# Patient Record
Sex: Male | Born: 1985 | Race: White | Hispanic: No | Marital: Married | Smoking: Never smoker
Health system: Southern US, Community
[De-identification: ages and names within clinical notes are randomized; demographics above are authoritative.]

---

## 2006-05-06 ENCOUNTER — Emergency Department: Payer: Self-pay | Admitting: Emergency Medicine

## 2008-02-19 ENCOUNTER — Emergency Department: Payer: Self-pay | Admitting: Emergency Medicine

## 2008-12-10 ENCOUNTER — Emergency Department: Payer: Self-pay | Admitting: Emergency Medicine

## 2010-03-13 ENCOUNTER — Emergency Department: Payer: Self-pay | Admitting: Unknown Physician Specialty

## 2010-11-14 IMAGING — CR DG FOOT COMPLETE 3+V*L*
1 series · 3 of 3 positions shown · non-contrast
Comparison: None

REASON FOR EXAM: pain, injury, swelling
COMMENTS:

PROCEDURE:     DXR - DXR FOOT LT COMP W/OBLIQUES  - December 10, 2008  [DATE]
RESULT:     History: Pain

[Series 1: view not recorded · 0.17mm/px · 3 of 3 slices shown]
[im 1/3]
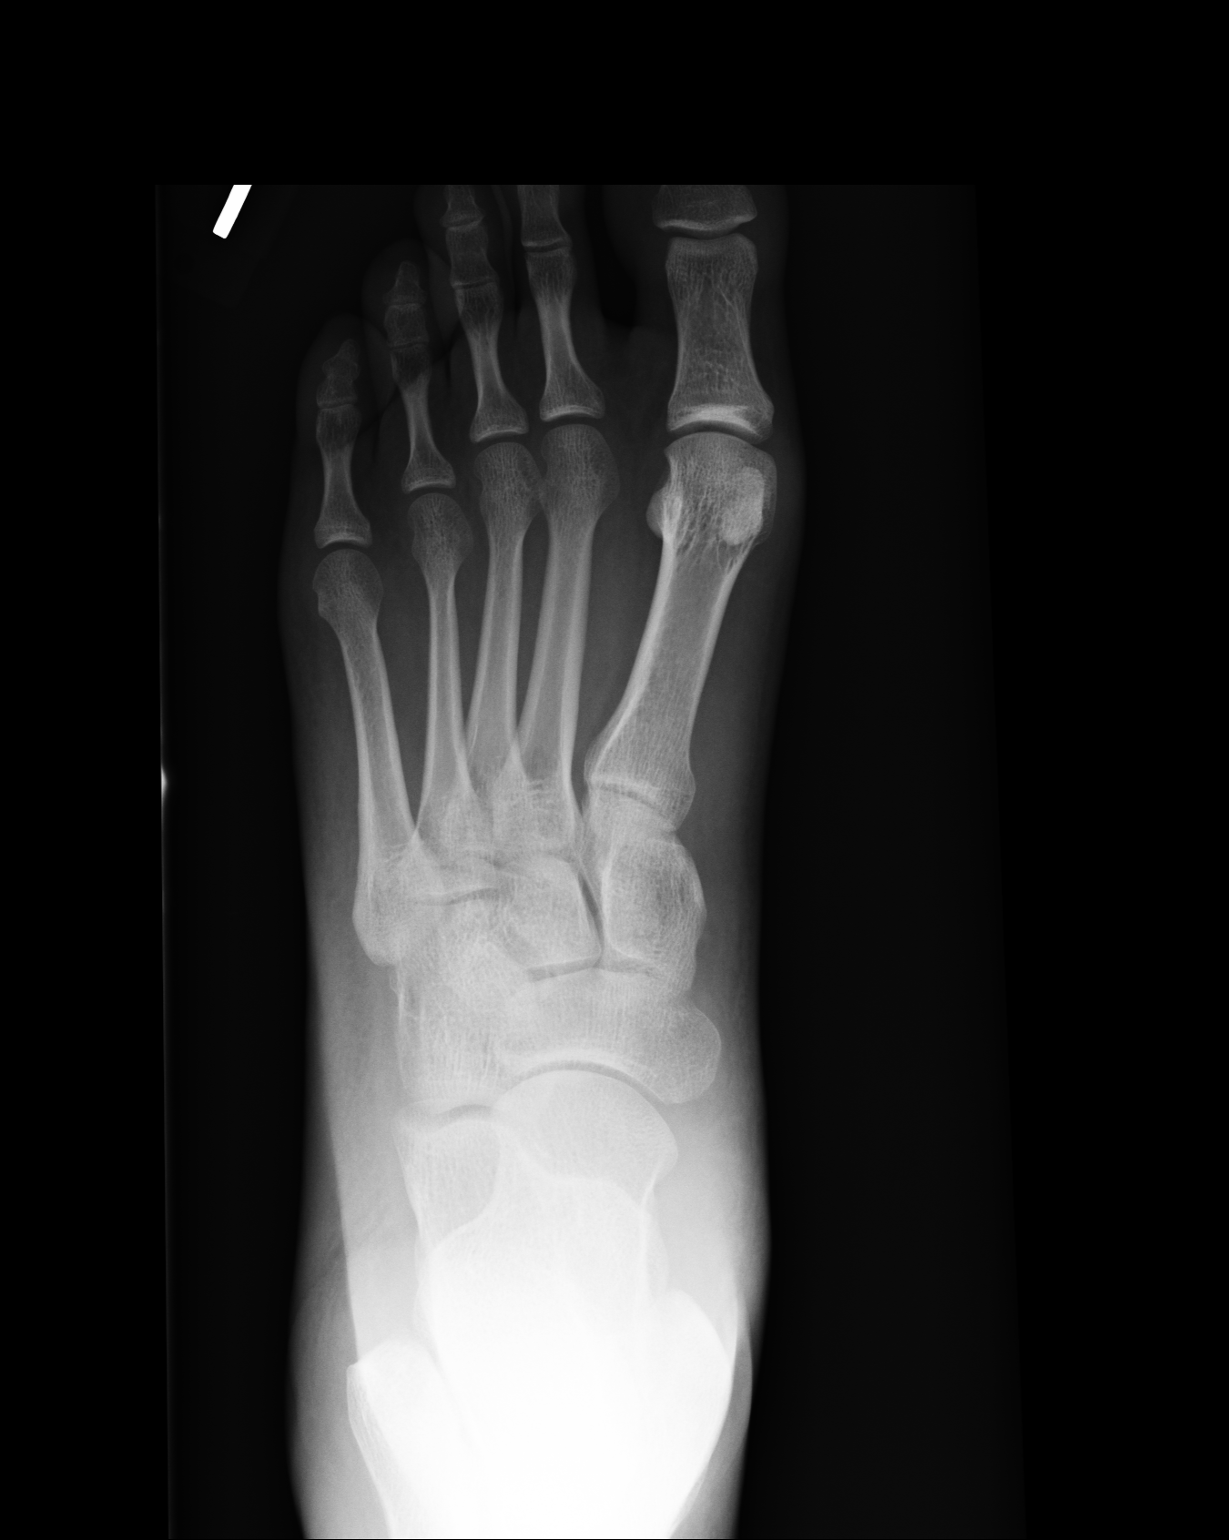
[im 2/3]
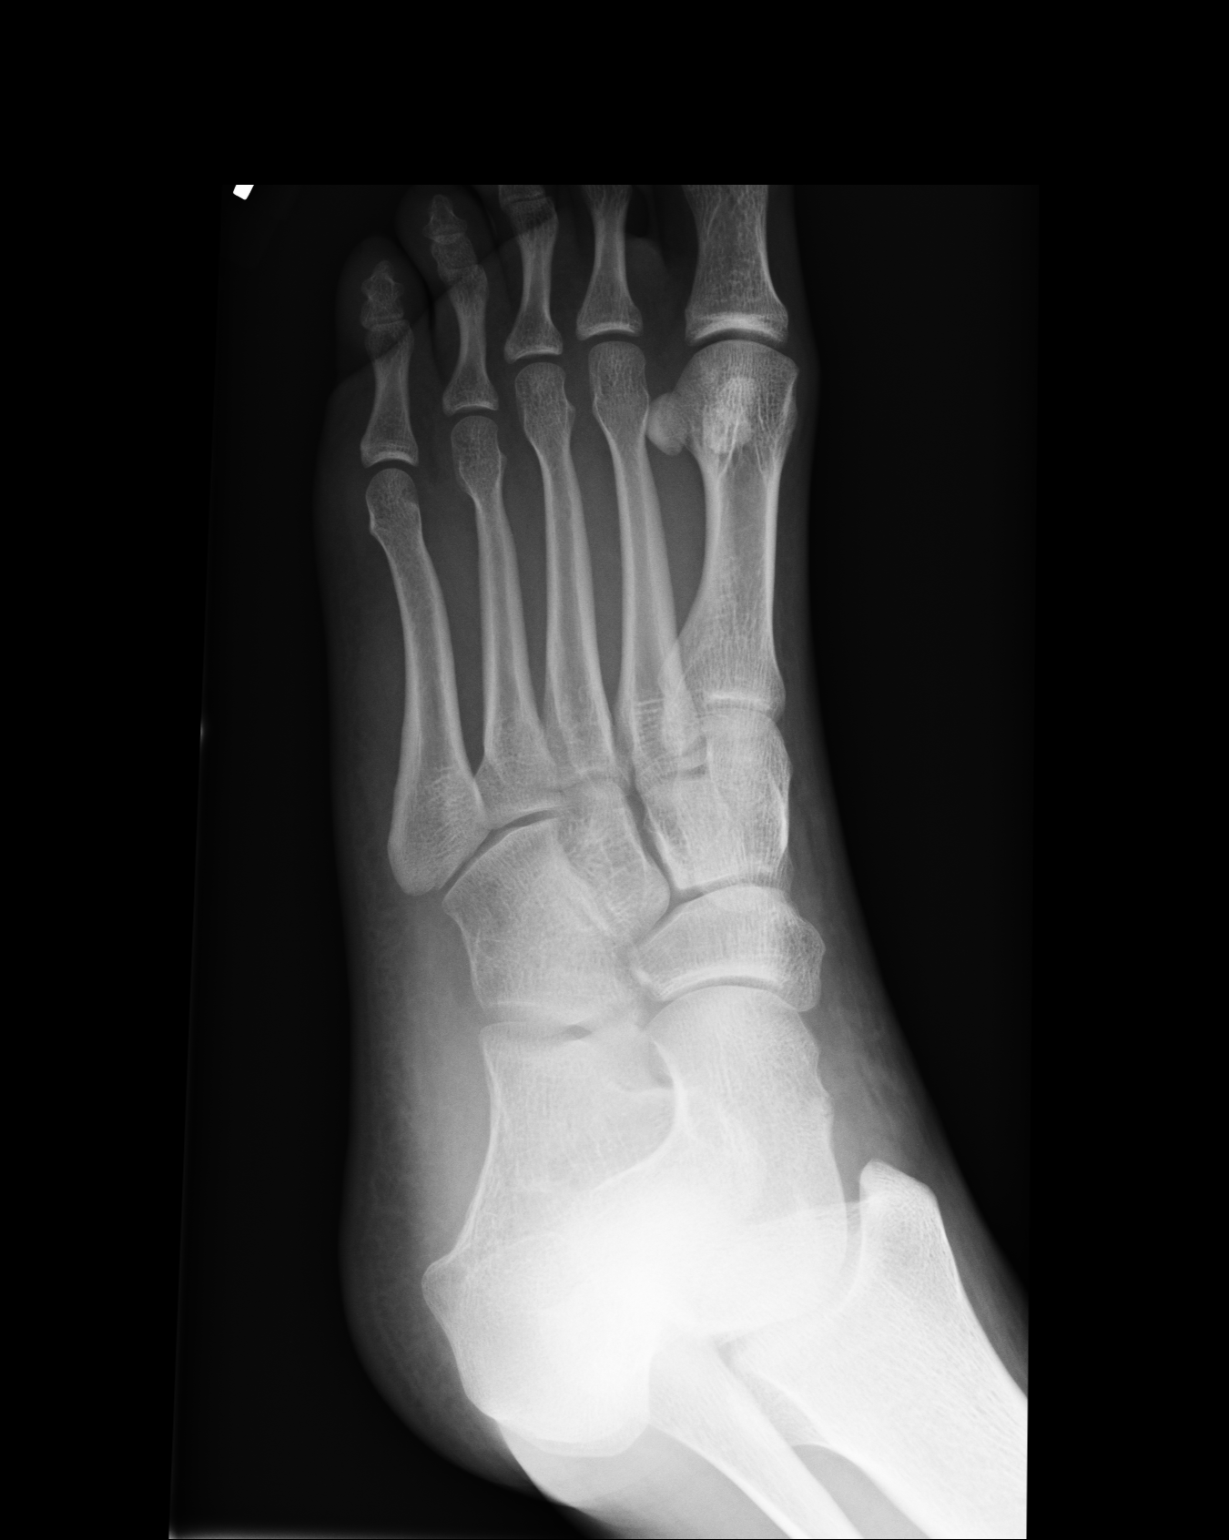
[im 3/3]
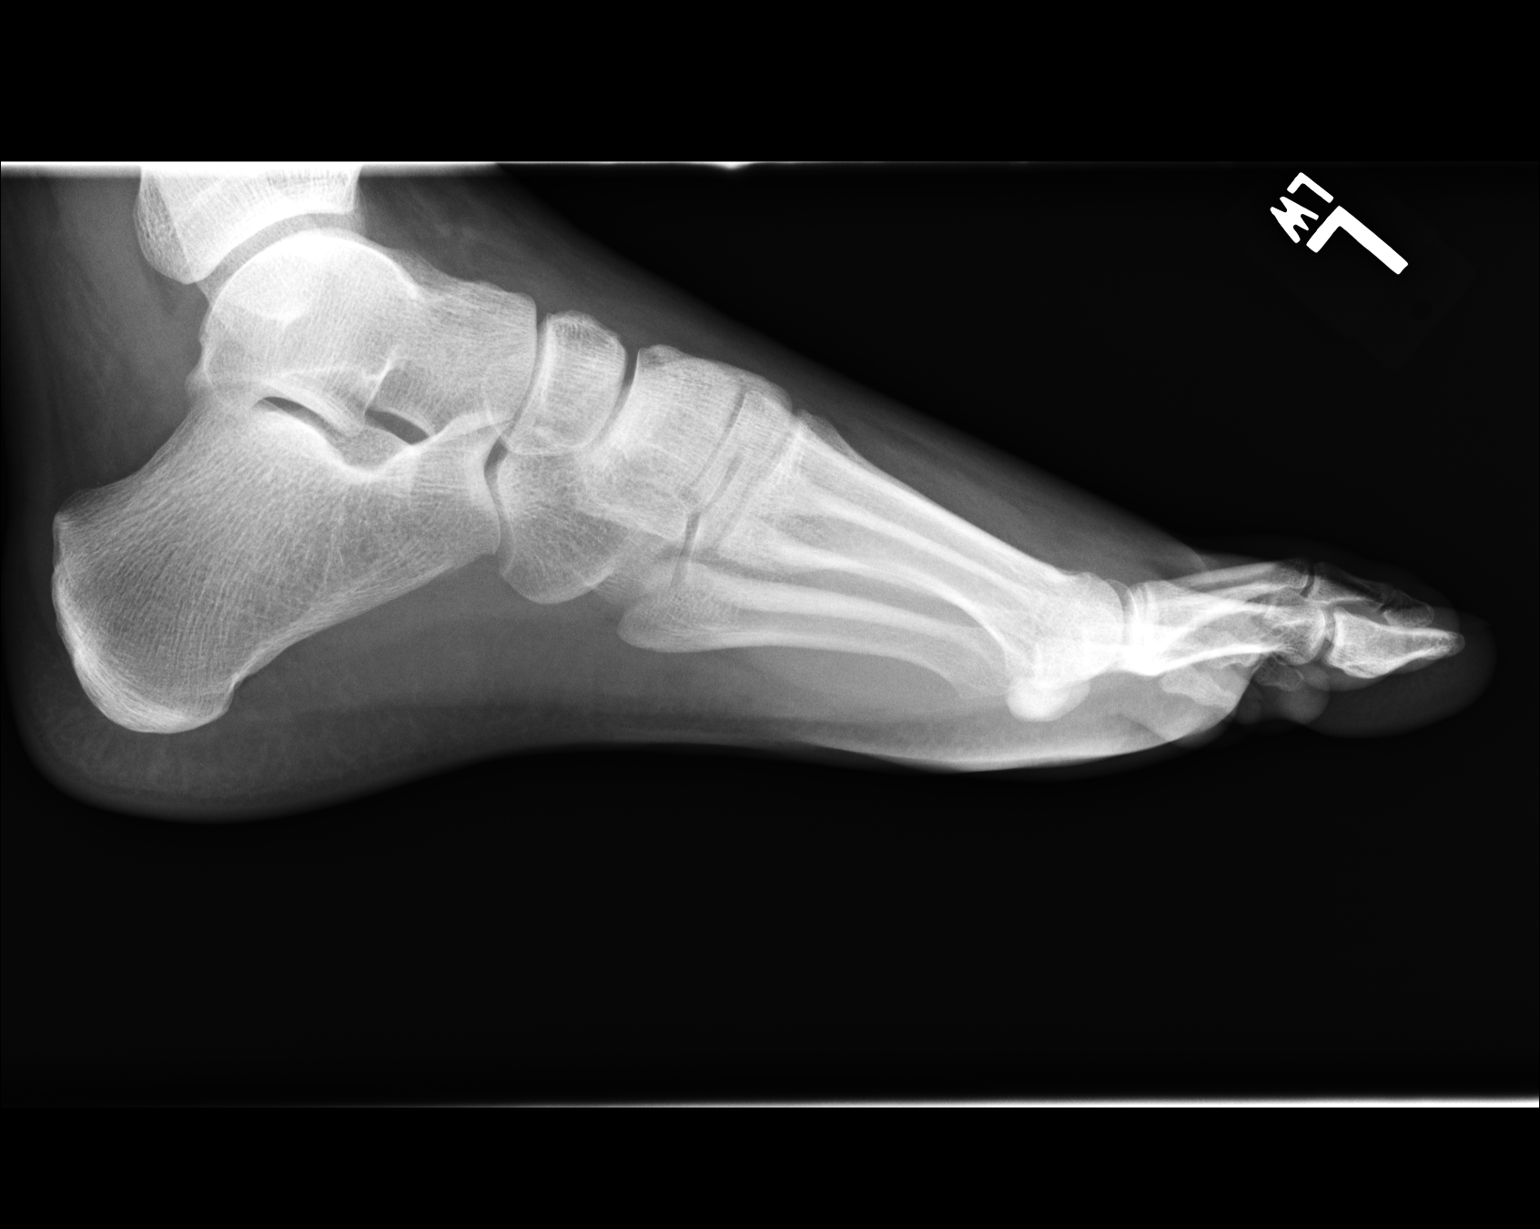

[3 of 3 positions shown; findings below may reference images not displayed]

FINDINGS: AP, oblique, and lateral views of the left foot demonstrates no fracture or
dislocation. There is no soft tissue abnormality. There is no subcutaneous
emphysema or radiopaque foreign bodies.
IMPRESSION: No acute osseous injury of the left foot.

## 2011-08-01 ENCOUNTER — Emergency Department: Payer: Self-pay | Admitting: Emergency Medicine

## 2011-09-12 ENCOUNTER — Ambulatory Visit: Payer: Self-pay

## 2012-06-27 ENCOUNTER — Emergency Department: Payer: Self-pay | Admitting: Emergency Medicine

## 2013-05-09 ENCOUNTER — Emergency Department: Payer: Self-pay | Admitting: Internal Medicine

## 2013-05-14 ENCOUNTER — Emergency Department: Payer: Self-pay | Admitting: Emergency Medicine

## 2013-05-14 LAB — BASIC METABOLIC PANEL
Anion Gap: 6 — ABNORMAL LOW (ref 7–16)
BUN: 18 mg/dL (ref 7–18)
Calcium, Total: 9 mg/dL (ref 8.5–10.1)
Chloride: 107 mmol/L (ref 98–107)
Co2: 25 mmol/L (ref 21–32)
Creatinine: 1.21 mg/dL (ref 0.60–1.30)
EGFR (African American): >60
Osmolality: 278 (ref 275–301)
Potassium: 3.9 mmol/L (ref 3.5–5.1)

## 2013-05-14 LAB — CBC: HCT: 41.8 % (ref 40.0–52.0)

## 2017-09-21 ENCOUNTER — Encounter: Payer: Self-pay | Admitting: Emergency Medicine

## 2017-09-21 ENCOUNTER — Emergency Department
Admission: EM | Admit: 2017-09-21 | Discharge: 2017-09-21 | Disposition: A | Discharge status: Home / Self Care | Payer: Self-pay | Attending: Emergency Medicine | Admitting: Emergency Medicine

## 2017-09-21 DIAGNOSIS — J020 Streptococcal pharyngitis: Secondary | ICD-10-CM | POA: Insufficient documentation

## 2017-09-21 MED ORDER — AMOXICILLIN 500 MG PO TABS: 500.0000 mg | ORAL_TABLET | Freq: Three times a day (TID) | ORAL | 0 refills | Status: DC

## 2017-09-21 NOTE — ED Provider Notes (Signed)
[facility] Emergency Department Provider Note  ____________________________________________  Time seen: Approximately 7:39 PM  I have reviewed the triage vital signs and the nursing notes.   HISTORY  Chief Complaint Sore Throat    HPI Shane Kramer is a 32 y.o. male who presents to the emergency department for evaluation and treatment of sore throat. Symptoms started yesterday. Wife and children all have strep throat. No alleviating measures have been attempted. He has had body aches and fever as well as a single episode of vomiting. No alleviating measures have been attempted for this complaint.  History reviewed. No pertinent past medical history.  There are no active problems to display for this patient.   History reviewed. No pertinent surgical history.  Prior to Admission medications   Medication Sig Start Date End Date Taking? Authorizing Provider  amoxicillin (AMOXIL) 500 MG tablet Take 1 tablet (500 mg total) by mouth 3 (three) times daily. 09/21/17   Chinita Pester, FNP    Allergies Patient has no allergy information on record.  No family history on file.  Social History Social History   Tobacco Use  . Smoking status: Never Smoker  . Smokeless tobacco: Never Used  Substance Use Topics  . Alcohol use: Yes  . Drug use: Never    Review of Systems Constitutional: Positive for fever. Eyes: No visual changes. ENT: Positive for sore throat; negative for difficulty swallowing. Respiratory: Denies shortness of breath. Gastrointestinal: No abdominal pain.  No nausea, no vomiting.  No diarrhea.  Genitourinary: Negative for dysuria. Musculoskeletal: Positive for generalized body aches. Skin: Negative for rash. Neurological: Positive for headaches, negative for focal weakness or numbness.  ____________________________________________   PHYSICAL EXAM:  VITAL SIGNS: ED Triage Vitals  Enc Vitals Group     BP 09/21/17 1822 130/79    Pulse Rate 09/21/17 1822 (!) 106     Resp 09/21/17 1822 20     Temp 09/21/17 1822 99.2 F (37.3 C)     Temp Source 09/21/17 1822 Oral     SpO2 09/21/17 1822 98 %     Weight 09/21/17 1824 210 lb (95.3 kg)     Height 09/21/17 1824 6' (1.829 m)     Head Circumference --      Peak Flow --      Pain Score 09/21/17 1823 8     Pain Loc --      Pain Edu? --      Excl. in GC? --    Constitutional: Alert and oriented. Well appearing and in no acute distress. Eyes: Conjunctivae are normal.  Head: Atraumatic. Nose: No congestion/rhinnorhea. Mouth/Throat: Mucous membranes are moist.  Oropharynx erythematous, tonsils 2+ with exudate. Uvula is midline. Neck: No stridor.  Lymphatic: Lymphadenopathy: anterior cervical lymphadenopathy Cardiovascular: Normal rate, regular rhythm. Good peripheral circulation. Respiratory: Normal respiratory effort. Lungs CTAB. Gastrointestinal: Soft and nontender. Musculoskeletal: No lower extremity tenderness nor edema.  Neurologic:  Normal speech and language. No gross focal neurologic deficits are appreciated. Speech is normal. No gait instability. Skin:  Skin is warm, dry and intact. No rash noted Psychiatric: Mood and affect are normal. Speech and behavior are normal.  ____________________________________________   LABS (all labs ordered are listed, but only abnormal results are displayed)  Labs Reviewed - No data to display ____________________________________________  EKG  Not indicated. ____________________________________________  RADIOLOGY  Not indicated. ____________________________________________   PROCEDURES  Procedure(s) performed: None  Critical Care performed: No ____________________________________________   INITIAL IMPRESSION / ASSESSMENT AND PLAN /  ED COURSE  32 year old male presenting to the emergency department for evaluation and treatment of sore throat. With the significant exposure to strep, he will be treated with  amoxicillin.  Pertinent labs & imaging results that were available during my care of the patient were reviewed by me and considered in my medical decision making (see chart for details). ____________________________________________  New Prescriptions   AMOXICILLIN (AMOXIL) 500 MG TABLET    Take 1 tablet (500 mg total) by mouth 3 (three) times daily.    FINAL CLINICAL IMPRESSION(S) / ED DIAGNOSES  Final diagnoses:  Strep throat    If controlled substance prescribed during this visit, 12 month history viewed on the [facility] prior to issuing an initial prescription for Schedule II or III opiod.   Note:  This document was prepared using Dragon voice recognition software and may include unintentional dictation errors.    Chinita Pesterriplett, Janelie Goltz B, FNP 09/21/17 Babette [id number]    Minna AntisPaduchowski, Kevin, MD 09/22/17 [phone]

## 2017-09-21 NOTE — ED Triage Notes (Signed)
Pt arrived with complaints of sore throat since yesterday.

## 2020-02-10 ENCOUNTER — Ambulatory Visit: Payer: Self-pay | Admitting: Urology

## 2020-02-19 ENCOUNTER — Ambulatory Visit (INDEPENDENT_AMBULATORY_CARE_PROVIDER_SITE_OTHER): Payer: Managed Care, Other (non HMO) | Admitting: Urology

## 2020-02-19 ENCOUNTER — Other Ambulatory Visit: Payer: Self-pay

## 2020-02-19 ENCOUNTER — Encounter: Payer: Self-pay | Admitting: Urology

## 2020-02-19 VITALS — BP 143/80 | HR 65 | Ht 72.0 in | Wt 203.0 lb

## 2020-02-19 DIAGNOSIS — Z3009 Encounter for other general counseling and advice on contraception: Secondary | ICD-10-CM | POA: Diagnosis not present

## 2020-02-19 NOTE — Patient Instructions (Signed)

## 2020-02-19 NOTE — Progress Notes (Signed)
02/19/2020 8:55 AM   Ria Comment [birth year] [id number]  Referring provider: No referring provider defined for this encounter.  Chief Complaint  Patient presents with  . VAS Consult    HPI: Shane Kramer is a 34 y.o. male who presents for vasectomy consultation.     Married with 3 children and has 1 stepchild  No prior history of urologic problems and specifically denies history chronic scrotal pain, epididymitis or orchitis  No prior hernia/pelvic surgery  No history of bleeding or clotting disorders   PMH: History reviewed. No pertinent past medical history.  Surgical History: History reviewed. No pertinent surgical history.  Home Medications:  Allergies as of 02/19/2020   No Known Allergies     Medication List       Accurate as of February 19, 2020  8:55 AM. If you have any questions, ask your nurse or doctor.        STOP taking these medications   amoxicillin 500 MG tablet Commonly known as: AMOXIL Stopped by: Riki Altes, MD       Allergies: No Known Allergies  Family History: History reviewed. No pertinent family history.  Social History:  reports that he has never smoked. He has never used smokeless tobacco. He reports current alcohol use. He reports that he does not use drugs.   Physical Exam: BP (!) 143/80   Pulse 65   Ht 6' (1.829 m)   Wt 203 lb (92.1 kg)   BMI 27.53 kg/m   Constitutional:  Alert and oriented, No acute distress. HEENT: [place] AT, moist mucus membranes.  Trachea midline, no masses. Cardiovascular: No clubbing, cyanosis, or edema. Respiratory: Normal respiratory effort, no increased work of breathing. GI: Abdomen is soft, nontender, nondistended, no abdominal masses GU: Phallus circumcised without lesions.  Testes descended bilaterally without masses or tenderness, spermatic cord/epididymis palpably normal bilaterally; vasa easily palpable Skin: No rashes, bruises or suspicious lesions. Neurologic: Grossly intact,  no focal deficits, moving all 4 extremities. Psychiatric: Normal mood and affect.   Assessment & Plan:    1.  Undesired fertility  We had a long discussion about vasectomy. We specifically discussed the procedure, recovery and the risks, benefits and alternatives of vasectomy. I explained that the procedure entails removal of a segment of each vas deferens, each of which conducts sperm, and that the purpose of this procedure is to cause sterility (inability to produce children or cause pregnancy). Vasectomy is intended to be permanent and irreversible form of contraception. Options for fertility after vasectomy include vasectomy reversal, or sperm retrieval with in vitro fertilization. These options are not always successful, and they may be expensive. We discussed reversible forms of birth control such as condoms, IUD or diaphragms, as well as the option of freezing sperm in a sperm bank prior to the vasectomy procedure. We discussed the importance of avoiding strenuous exercise for four days after vasectomy, and the importance of refraining from any form of ejaculation for seven days after vasectomy. I explained that vasectomy does not produce immediate sterility so another form of contraceptive must be used until sterility is assured by having semen checked for sperm. Thus, a post vasectomy semen analysis is necessary to confirm sterility. Rarely, vasectomy must be repeated. We discussed the approximately 1 in 2,000 risk of pregnancy after vasectomy for men who have post-vasectomy semen analysis showing absent sperm or rare non-motile sperm. Typical side effects include a small amount of oozing blood, some discomfort and mild swelling in the area of  incision.  Vasectomy does not affect sexual performance, function, please, sensation, interest, desire, satisfaction, penile erection, volume of semen or ejaculation. Other rare risks include allergy or adverse reaction to an anesthetic, testicular atrophy,  hematoma, infection/abscess, prolonged tenderness of the vas deferens, pain, swelling, painful nodule or scare (called sperm granuloma) or epididymtis. We discussed chronic testicular pain syndrome. This has been reported to occur in as many as 1-2% of men and may be permanent. This can be treated with medication, small procedures or (rarely) surgery.   He desires to schedule vasectomy   Rx Valium 10 mg 30 minutes prior to procedure sent to pharmacy as a preprocedure anxiolytic   Riki Altes, MD  [facility] [address], [address] [place], [place] [address] [phone]

## 2020-03-02 ENCOUNTER — Ambulatory Visit: Payer: Self-pay

## 2020-03-25 ENCOUNTER — Other Ambulatory Visit: Payer: Self-pay | Admitting: Urology

## 2020-03-25 ENCOUNTER — Encounter: Payer: Self-pay | Admitting: Urology

## 2020-03-25 ENCOUNTER — Ambulatory Visit (INDEPENDENT_AMBULATORY_CARE_PROVIDER_SITE_OTHER): Payer: Managed Care, Other (non HMO) | Admitting: Urology

## 2020-03-25 ENCOUNTER — Other Ambulatory Visit: Payer: Self-pay

## 2020-03-25 VITALS — BP 124/75 | HR 67 | Ht 72.0 in | Wt 201.0 lb

## 2020-03-25 DIAGNOSIS — Z302 Encounter for sterilization: Secondary | ICD-10-CM

## 2020-03-25 MED ORDER — DIAZEPAM 10 MG PO TABS: ORAL_TABLET | ORAL | 0 refills | Status: DC

## 2020-03-25 MED ORDER — HYDROCODONE-ACETAMINOPHEN 5-325 MG PO TABS: 1.0000 | ORAL_TABLET | ORAL | 0 refills | Status: DC | PRN

## 2020-03-25 NOTE — Patient Instructions (Signed)

## 2020-03-25 NOTE — Progress Notes (Signed)
Vasectomy Procedure Note  Indications: The patient is a 33 y.o. male who presents today for elective sterilization.  He has been consented for the procedure.  He is aware of the risks and benefits.  He had no additional questions.  He agrees to proceed.  He denies any other significant change since his last visit.  Pre-operative Diagnosis: Elective sterilization  Post-operative Diagnosis: Elective sterilization  Premedication: Valium 10 mg po  Surgeon: Jaque Dacy C. Azyah Flett, M.D  Description: The patient was prepped and draped in the standard fashion.  The right vas deferens was identified and brought superiorly to the anterior scrotal skin.  The skin and vas was then anesthetized utilizing 6 ml 1% lidocaine.  A small stab incision was made and spread with the vas dissector.  The vas was grasped utilizing the vas clamp and elevated out of the incision.  The vas was dissected free from surrounding tissue and vessels and an ~1 cm segment was excised.  The vas lumens were cauterized utilizing electrocautery.  The distal segment was buried in the surrounding sheath with a 3-0 chromic suture.  No significant bleeding was observed.  The vas ends were then dropped back into the hemiscrotum.  The skin was closed with hemostatic pressure.  An identical procedure was performed on the contralateral side.  Clean dry gauze was applied to the incision sites.  The patient tolerated the procedure well.  Complications:None  Recommendations: 1.  No lifting greater than 10 pounds or strenuousactivity for 1 week. 2.  Scrotal support for 1 week. 3.  Shower only for 1 week; may shower in the morning 4.  May resume intercourse in one week if no significant discomfort.  Continue alternate contraception for 12 weeks.  5.  Call for significant pain, swelling, redness, drainage or fever greater than 100.5. 6.  Rx hydrocodone/APAP 5/325 1-2 every 6 hours as needed for pain. 7.  Follow-up semen analysis in 12  weeks.   Syniah Berne, MD 

## 2020-04-01 ENCOUNTER — Other Ambulatory Visit: Payer: Self-pay

## 2020-04-01 ENCOUNTER — Encounter: Payer: Self-pay | Admitting: Urology

## 2020-04-01 ENCOUNTER — Ambulatory Visit (INDEPENDENT_AMBULATORY_CARE_PROVIDER_SITE_OTHER): Payer: Managed Care, Other (non HMO) | Admitting: Urology

## 2020-04-01 VITALS — BP 128/60 | HR 72 | Temp 98.3°F

## 2020-04-01 DIAGNOSIS — N5089 Other specified disorders of the male genital organs: Secondary | ICD-10-CM

## 2020-04-01 MED ORDER — SULFAMETHOXAZOLE-TRIMETHOPRIM 800-160 MG PO TABS: 1.0000 | ORAL_TABLET | Freq: Two times a day (BID) | ORAL | 0 refills | Status: DC

## 2020-04-06 NOTE — Progress Notes (Signed)
   04/07/2020 10:06 AM   Ria Comment 1985-08-26 [id number]  Referring provider: Carren Rang, PA-C [address] [place],  [place] [address] Chief Complaint  Patient presents with  . Follow-up    HPI: Shane Kramer is a 33 y.o. male who presents today for evaluation of post vasectomy complications. The patient is most interested in a 2nd opinion.   Patient underwent a vasectomy on 03/25/2020 with Dr. Lonna Cobb.   He reports having some pain and knots/swelling on his scrotum. He was treated with antibiotics whish improved the pain and swelling. He is still concerned but because he still has some discomfort. He feels like there is another knot forming on his scrotum. For pain he has been taking ibuprofen.     No fevers or chills.     PMH: No past medical history on file.  Surgical History: No past surgical history on file.  Home Medications:  Allergies as of 04/07/2020   No Known Allergies     Medication List       Accurate as of April 07, 2020 10:06 AM. If you have any questions, ask your nurse or doctor.        STOP taking these medications   diazepam 10 MG tablet Commonly known as: VALIUM Stopped by: Vanna Scotland, MD   HYDROcodone-acetaminophen 5-325 MG tablet Commonly known as: NORCO/VICODIN Stopped by: Vanna Scotland, MD   sulfamethoxazole-trimethoprim 800-160 MG tablet Commonly known as: BACTRIM DS Stopped by: Vanna Scotland, MD       Allergies: No Known Allergies  Family History: No family history on file.  Social History:  reports that he has never smoked. He has never used smokeless tobacco. He reports current alcohol use. He reports that he does not use drugs.   Physical Exam: BP 131/81   Pulse 71   Constitutional:  Alert and oriented, No acute distress. HEENT:  AT, moist mucus membranes.  Trachea midline, no masses. Cardiovascular: No clubbing, cyanosis, or edema. Respiratory: Normal respiratory effort, no  increased work of breathing GU:  Fullness on both spermatic cords. Scrotum without lesions, cysts, rashes and/or edema. Testicles are located scrotally bilaterally. No masses are appreciated in the testicles. Left and right epididymis are normal.   Skin well healed. Skin: No rashes, bruises or suspicious lesions. Neurologic: Grossly intact, no focal deficits, moving all 4 extremities. Psychiatric: Normal mood and affect.  Assessment & Plan:    1. Scrotal pain  - Physical exam was completely benign, fullness in the cord likely from edema and or possible very small minimal hematoma  - There is no suspicion for infection.  - Discussed supportive care and continuing NSAIDs   [facility] [address], [address] [place], [place] [address] [phone]  I, Theador Hawthorne, am acting as a scribe for Dr. Vanna Scotland.  I have reviewed the above documentation for accuracy and completeness, and I agree with the above.   Vanna Scotland, MD

## 2020-04-07 ENCOUNTER — Ambulatory Visit (INDEPENDENT_AMBULATORY_CARE_PROVIDER_SITE_OTHER): Payer: Managed Care, Other (non HMO) | Admitting: Urology

## 2020-04-07 ENCOUNTER — Other Ambulatory Visit: Payer: Self-pay

## 2020-04-07 VITALS — BP 131/81 | HR 71

## 2020-04-07 DIAGNOSIS — N5082 Scrotal pain: Secondary | ICD-10-CM

## 2020-04-13 NOTE — Progress Notes (Signed)
04/01/2020 2:25 PM   BLUFORD SEDLER 1986/02/03 [id number]  Referring provider: Carren Rang, PA-C [address] [place],  [place] [address]  Chief Complaint  Patient presents with  . Follow-up    HPI: Shane Kramer is a 34 y.o. male who is status post vasectomy who is having pain and a lump in his right scrotum.  He states that his biggest concern is that his left side feels completely normal to him and he is not experiencing any pain, but he feels a lump on his right side and it is painful.  The pain has been occurring since the vasectomy.    He underwent his vasectomy on 03/25/2020 with Dr. Lonna Cobb.  The vasectomy went as expected and there were no complications.  He states he has been compliant with the post vasectomy instructions.    Patient denies any modifying or aggravating factors.  Patient denies any gross hematuria, dysuria or suprapubic/flank pain.  Patient denies any fevers, chills, nausea or vomiting.    PMH: No past medical history on file.  Surgical History: No past surgical history on file.  Home Medications:  Allergies as of 04/01/2020   No Known Allergies     Medication List       Accurate as of April 01, 2020 11:59 PM. If you have any questions, ask your nurse or doctor.        diazepam 10 MG tablet Commonly known as: VALIUM 1 tab po 30 min prior to procedure   HYDROcodone-acetaminophen 5-325 MG tablet Commonly known as: NORCO/VICODIN Take 1 tablet by mouth every 4 (four) hours as needed for moderate pain.   sulfamethoxazole-trimethoprim 800-160 MG tablet Commonly known as: BACTRIM DS Take 1 tablet by mouth every 12 (twelve) hours. Started by: Michiel Cowboy, PA-C       Allergies: No Known Allergies  Family History: No family history on file.  Social History:  reports that he has never smoked. He has never used smokeless tobacco. He reports current alcohol use. He reports that he does not use  drugs.  ROS: Pertinent ROS in HPI  Physical Exam: BP 128/60   Pulse 72   Temp 98.3 F (36.8 C)   Constitutional:  Well nourished. Alert and oriented, No acute distress. HEENT: [place] AT, mask in place.  Trachea midline Cardiovascular: No clubbing, cyanosis, or edema. Respiratory: Normal respiratory effort, no increased work of breathing. GU: Scrotum without lesions, cysts, rashes and/or edema.  Incisions are clean and dry.  Left testicles is located scrotally. No masses are appreciated in the testicles. Left epididymis is normal.  Right testicle is located scrotally.   Right epididymis is normal.  A small tender knot (Plain M&M) is palpated within the right spermatic cord.   Neurologic: Grossly intact, no focal deficits, moving all 4 extremities. Psychiatric: Normal mood and affect.  Laboratory Data: Lab Results  Component Value Date   WBC 13.9 (H) 05/14/2013   HGB 14.6 05/14/2013   HCT 41.8 05/14/2013   MCV 88 05/14/2013   PLT 173 05/14/2013    Lab Results  Component Value Date   CREATININE 1.21 05/14/2013    Urinalysis No results found for: COLORURINE, APPEARANCEUR, LABSPEC, PHURINE, GLUCOSEU, HGBUR, BILIRUBINUR, KETONESUR, PROTEINUR, UROBILINOGEN, NITRITE, LEUKOCYTESUR  I have reviewed the labs.   Pertinent Imaging: N/A  Assessment & Plan:    1. Sperm granuloma Explained that what he is palpating may be a small sperm granuloma.  Prescribed him Bactrim DS, BID x 5  days. Reminded him to continue to use alternative methods of birth control until he is cleared with his 12 week specimen  Return if symptoms worsen or fail to improve.  These notes generated with voice recognition software. I apologize for typographical errors.  Michiel Cowboy, PA-C  [facility] [address]  [address] [place], [place] [address] [phone]

## 2020-06-08 ENCOUNTER — Emergency Department
Admission: EM | Admit: 2020-06-08 | Discharge: 2020-06-08 | Disposition: A | Discharge status: Home / Self Care | Payer: Managed Care, Other (non HMO) | Attending: Emergency Medicine | Admitting: Emergency Medicine

## 2020-06-08 ENCOUNTER — Emergency Department: Payer: Managed Care, Other (non HMO)

## 2020-06-08 ENCOUNTER — Other Ambulatory Visit: Payer: Self-pay

## 2020-06-08 DIAGNOSIS — J1289 Other viral pneumonia: Secondary | ICD-10-CM | POA: Insufficient documentation

## 2020-06-08 DIAGNOSIS — J1282 Pneumonia due to coronavirus disease 2019: Secondary | ICD-10-CM

## 2020-06-08 DIAGNOSIS — U071 COVID-19: Secondary | ICD-10-CM | POA: Insufficient documentation

## 2020-06-08 DIAGNOSIS — R0602 Shortness of breath: Secondary | ICD-10-CM | POA: Diagnosis present

## 2020-06-08 LAB — COMPREHENSIVE METABOLIC PANEL
ALT: 44 U/L (ref 0–44)
AST: 62 U/L — ABNORMAL HIGH (ref 15–41)
Albumin: 4.2 g/dL (ref 3.5–5.0)
Alkaline Phosphatase: 42 U/L (ref 38–126)
Anion gap: 10 (ref 5–15)
BUN: 12 mg/dL (ref 6–20)
CO2: 26 mmol/L (ref 22–32)
Calcium: 8.9 mg/dL (ref 8.9–10.3)
Chloride: 102 mmol/L (ref 98–111)
Creatinine, Ser: 1 mg/dL (ref 0.61–1.24)
GFR, Estimated: >60 mL/min (ref >60)
Glucose, Bld: 114 mg/dL — ABNORMAL HIGH (ref 70–99)
Potassium: 3.6 mmol/L (ref 3.5–5.1)
Sodium: 138 mmol/L (ref 135–145)
Total Bilirubin: 0.9 mg/dL (ref 0.3–1.2)
Total Protein: 8 g/dL (ref 6.5–8.1)

## 2020-06-08 LAB — CBC WITH DIFFERENTIAL/PLATELET
Abs Immature Granulocytes: 0.01 10*3/uL (ref 0.00–0.07)
Basophils Absolute: 0 10*3/uL (ref 0.0–0.1)
Basophils Relative: 0 %
Eosinophils Absolute: 0 10*3/uL (ref 0.0–0.5)
Eosinophils Relative: 0 %
HCT: 41.5 % (ref 39.0–52.0)
Hemoglobin: 14.5 g/dL (ref 13.0–17.0)
Immature Granulocytes: 0 %
Lymphocytes Relative: 27 %
Lymphs Abs: 0.9 10*3/uL (ref 0.7–4.0)
MCH: 30.8 pg (ref 26.0–34.0)
MCHC: 34.9 g/dL (ref 30.0–36.0)
MCV: 88.1 fL (ref 80.0–100.0)
Monocytes Absolute: 0.3 10*3/uL (ref 0.1–1.0)
Monocytes Relative: 10 %
Neutro Abs: 2 10*3/uL (ref 1.7–7.7)
Neutrophils Relative %: 63 %
Platelets: 134 10*3/uL — ABNORMAL LOW (ref 150–400)
RBC: 4.71 MIL/uL (ref 4.22–5.81)
RDW: 12.1 % (ref 11.5–15.5)
WBC: 3.2 10*3/uL — ABNORMAL LOW (ref 4.0–10.5)
nRBC: 0 % (ref 0.0–0.2)

## 2020-06-08 LAB — FIBRIN DERIVATIVES D-DIMER (ARMC ONLY): Fibrin derivatives D-dimer (ARMC): 703.51 ng/mL (FEU) — ABNORMAL HIGH (ref 0.00–499.00)

## 2020-06-08 MED ORDER — IOHEXOL 350 MG/ML SOLN
75.0000 mL | Freq: Once | INTRAVENOUS | Status: AC | PRN
  Administered 2020-06-08: 75 mL via INTRAVENOUS
  Filled 2020-06-08: qty 75

## 2020-06-08 MED ORDER — PREDNISONE 20 MG PO TABS
60.0000 mg | ORAL_TABLET | Freq: Once | ORAL | Status: AC
  Administered 2020-06-08: 23:00:00 60 mg via ORAL
  Filled 2020-06-08: qty 3

## 2020-06-08 MED ORDER — PROMETHAZINE-DM 6.25-15 MG/5ML PO SYRP: 5.0000 mL | ORAL_SOLUTION | Freq: Four times a day (QID) | ORAL | 0 refills | Status: DC | PRN

## 2020-06-08 MED ORDER — PREDNISONE 10 MG PO TABS: ORAL_TABLET | ORAL | 0 refills | Status: DC

## 2020-06-08 MED ORDER — HYDROCODONE-HOMATROPINE 5-1.5 MG/5ML PO SYRP
5.0000 mL | ORAL_SOLUTION | Freq: Once | ORAL | Status: AC
  Administered 2020-06-08: 23:00:00 5 mL via ORAL
  Filled 2020-06-08: qty 5

## 2020-06-08 MED ORDER — ALBUTEROL SULFATE HFA 108 (90 BASE) MCG/ACT IN AERS: 2.0000 | INHALATION_SPRAY | Freq: Four times a day (QID) | RESPIRATORY_TRACT | 2 refills | Status: AC | PRN

## 2020-06-08 NOTE — ED Triage Notes (Signed)
Pt dx with Covid last Tuesday and here for shob, no distress noted at this time. Pt scheduled for infusions tomorrow.

## 2020-06-08 NOTE — ED Notes (Signed)
Pt taken to CT.

## 2020-06-08 NOTE — Discharge Instructions (Addendum)
You were seen today shortness of breath due to Covid 19. Your xray shows pneumonia. Your CT did not show evidence of a blood clot. We have treated you with steroids, cough medication and Albuterol. There is no indication for antibiotics at this time. Rest, drink plenty of fluids, self quarantine for 10 days or until 3 days symptom free without fever or medication. Maintain social distancing, masking and frequent handwashing. Return to the ER for new or worsening symptoms.

## 2020-06-08 NOTE — ED Notes (Signed)
Pt refused covid swab. °

## 2020-06-08 NOTE — ED Provider Notes (Addendum)
[facility] Emergency Department Provider Note ____________________________________________  Time seen: 64  I have reviewed the triage vital signs and the nursing notes.  HISTORY  Chief Complaint  Shortness of Breath   HPI Shane Kramer is a 34 y.o. male presents to the ER today with complaint of headache, nasal congestion, cough, shortness of breath, fever and body aches.  He reports this started 6 days ago.  The headache is located in his forehead.  He describes the pain as pressure.  He denies dizziness or visual changes.  He is not blowing anything out of his nose.  The cough is dry and nonproductive.  He denies runny nose, ear pain, sore throat, loss of taste/smell, chest pain.  He has had some intermittent nausea and vomiting of mucus but denies diarrhea, constipation or blood in his stool.  He has run fevers up to 101 at home, had body aches and chills.  He has tried ibuprofen, albuterol and albuterol nebulizer OTC with minimal relief of symptoms.  He is scheduled to have monoclonal antibody infusions tomorrow.  He does not smoke.  He did not get his Covid vaccine.  No past medical history on file.  There are no problems to display for this patient.   No past surgical history on file.  Prior to Admission medications   Medication Sig Start Date End Date Taking? Authorizing Provider  albuterol (VENTOLIN HFA) 108 (90 Base) MCG/ACT inhaler Inhale 2 puffs into the lungs every 6 (six) hours as needed for wheezing or shortness of breath. 06/08/20   Lorre Munroe, NP  predniSONE (DELTASONE) 10 MG tablet Take 6 tabs day 1, 5 tabs day 2, 4 tabs day 3, 3 tabs day 4, 2 tabs day 5, 1 tab day 6 06/08/20   Lorre Munroe, NP  promethazine-dextromethorphan (PROMETHAZINE-DM) 6.25-15 MG/5ML syrup Take 5 mLs by mouth 4 (four) times daily as needed for cough. 06/08/20   Lorre Munroe, NP    Allergies Patient has no known allergies.  No family history on  file.  Social History Social History   Tobacco Use  . Smoking status: Never Smoker  . Smokeless tobacco: Never Used  Substance Use Topics  . Alcohol use: Yes  . Drug use: Never    Review of Systems  Constitutional: Positive for fever and body aches.  Negative for chills. Eyes: Negative for visual changes. ENT: Positive for nasal congestion.  Negative for runny nose, ear pain, loss of taste/smell.  Or sore throat. Cardiovascular: Negative for chest pain or chest tightness. Respiratory: Positive for cough and  shortness of breath. Gastrointestinal: Positive for nausea and vomiting.  Negative for abdominal pain, constipation, diarrhea or blood in stool. Genitourinary: Negative for dysuria. Neurological: Positive for headaches.  Negative for focal weakness, tingling or numbness. ____________________________________________  PHYSICAL EXAM:  VITAL SIGNS: ED Triage Vitals  Enc Vitals Group     BP 06/08/20 1905 (!) 135/93     Pulse Rate 06/08/20 1905 (!) 112     Resp 06/08/20 1905 20     Temp 06/08/20 1905 98.8 F (37.1 C)     Temp Source 06/08/20 1905 Oral     SpO2 06/08/20 1905 100 %     Weight 06/08/20 1906 205 lb (93 kg)     Height 06/08/20 1906 6' (1.829 m)     Head Circumference --      Peak Flow --      Pain Score 06/08/20 1906 7  Pain Loc --      Pain Edu? --      Excl. in GC? --     Constitutional: Alert and oriented. Well appearing and in no distress. Head: Normocephalic and atraumatic. Eyes: Conjunctivae are normal. PERRL. Normal extraocular movements Nose: No congestion/rhinorrhea/epistaxis. Mouth/Throat: Mucous membranes are moist.  No posterior pharynx erythema or exudate noted. Hematological/Lymphatic/Immunological: No cervical lymphadenopathy. Cardiovascular: Tachycardic, regular rhythm. Respiratory: Normal respiratory effort. No wheezes/rales/rhonchi. Gastrointestinal: Soft and nontender. No distention. Musculoskeletal: No difficulty with  gait. Neurologic:  Normal speech and language. No gross focal neurologic deficits are appreciated. Skin:  Skin is warm, dry and intact. No rash noted.  ____________________________________________   RADIOLOGY  Imaging Orders     DG Chest 2 View     CT Angio Chest PE W and/or Wo Contrast IMPRESSION: 1. Findings consistent with multifocal pneumonia greatest in the left lower lobe, consistent with COVID-19.  IMPRESSION: 1. No evidence of pulmonary embolus. 2. Multifocal bilateral pneumonia consistent with COVID 19.  LABS:  Labs Reviewed  CBC WITH DIFFERENTIAL/PLATELET - Abnormal; Notable for the following components:      Result Value   WBC 3.2 (*)    Platelets 134 (*)    All other components within normal limits  FIBRIN DERIVATIVES D-DIMER ([facility] ONLY) - Abnormal; Notable for the following components:   Fibrin derivatives D-dimer ([facility]) 703.51 (*)    All other components within normal limits  COMPREHENSIVE METABOLIC PANEL - Abnormal; Notable for the following components:   Glucose, Bld 114 (*)    AST 62 (*)    All other components within normal limits   ____________________________________________    INITIAL IMPRESSION / ASSESSMENT AND PLAN / ED COURSE  Covid 19, SOB:  Chest xray today c/w LLL viral pneumonia Will check CBC, CMET and D dimer Given elevated D, will obtain CTA chest- negative for PE Prednisone 60 mg PO x 1 Hycodan 5 mL PO x 1 RX for Pred Taper x 6 days RX for Albuterol inhaler 1-2 puffs Q4-6H prn RX for Promethazine DM for cough Follow up with PCP if needed, return precautions discussed ____________________________________________  FINAL CLINICAL IMPRESSION(S) / ED DIAGNOSES  Final diagnoses:  Pneumonia due to COVID-19 virus      Lorre Munroe, NP 06/08/20 2257    Lorre Munroe, NP 06/08/20 2310    Delton Prairie, MD 06/08/20 2315

## 2020-06-24 ENCOUNTER — Other Ambulatory Visit: Payer: Self-pay | Admitting: *Deleted

## 2020-06-24 DIAGNOSIS — Z9852 Vasectomy status: Secondary | ICD-10-CM

## 2020-06-25 ENCOUNTER — Other Ambulatory Visit: Payer: Self-pay

## 2020-06-25 ENCOUNTER — Other Ambulatory Visit: Payer: Commercial Managed Care - HMO

## 2020-06-25 DIAGNOSIS — Z9852 Vasectomy status: Secondary | ICD-10-CM

## 2020-06-26 ENCOUNTER — Encounter: Payer: Self-pay | Admitting: Urology

## 2020-06-26 LAB — POST-VAS SPERM EVALUATION,QUAL: Volume: 6.8 mL

## 2020-06-29 ENCOUNTER — Telehealth: Payer: Self-pay | Admitting: Family Medicine

## 2020-06-29 NOTE — Telephone Encounter (Signed)
LMOM for patient to return call.

## 2020-06-29 NOTE — Telephone Encounter (Signed)
-----   Message from Riki Altes, MD sent at 06/28/2020 10:06 AM EST ----- Semen analysis did show sperm present.  Continue alternate contraception and repeat analysis 4-6 weeks

## 2020-07-02 NOTE — Telephone Encounter (Signed)
Patient notified and lab appointment has been made.

## 2020-08-03 ENCOUNTER — Other Ambulatory Visit: Payer: Self-pay

## 2020-08-03 DIAGNOSIS — Z302 Encounter for sterilization: Secondary | ICD-10-CM

## 2020-08-04 ENCOUNTER — Other Ambulatory Visit: Payer: Commercial Managed Care - HMO

## 2020-08-04 ENCOUNTER — Other Ambulatory Visit: Payer: Self-pay

## 2020-08-04 ENCOUNTER — Other Ambulatory Visit: Payer: Self-pay | Admitting: Urology

## 2020-08-04 DIAGNOSIS — Z302 Encounter for sterilization: Secondary | ICD-10-CM

## 2020-08-05 LAB — POST-VAS SPERM EVALUATION,QUAL: Volume: 6.2 mL

## 2020-08-06 ENCOUNTER — Telehealth: Payer: Self-pay | Admitting: *Deleted

## 2020-08-06 NOTE — Telephone Encounter (Signed)
-----   Message from Riki Altes, MD sent at 08/06/2020 10:02 AM EST ----- Repeat semen analysis showed no sperm present.  Okay to use vasectomy as primary contraception.

## 2020-08-07 NOTE — Telephone Encounter (Signed)
Notified patient as instructed, patient pleased °

## 2021-01-01 ENCOUNTER — Ambulatory Visit
Admission: EM | Admit: 2021-01-01 | Discharge: 2021-01-01 | Disposition: A | Discharge status: Home / Self Care | Payer: Commercial Managed Care - HMO | Attending: Emergency Medicine | Admitting: Emergency Medicine

## 2021-01-01 ENCOUNTER — Other Ambulatory Visit: Payer: Self-pay

## 2021-01-01 DIAGNOSIS — L0100 Impetigo, unspecified: Secondary | ICD-10-CM | POA: Diagnosis not present

## 2021-01-01 MED ORDER — AMOXICILLIN-POT CLAVULANATE 875-125 MG PO TABS: 1.0000 | ORAL_TABLET | Freq: Two times a day (BID) | ORAL | 0 refills | Status: AC
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
## 2021-01-01 NOTE — ED Triage Notes (Signed)
Pt c/o an area of irritation to his lower lip that started Tuesday. Pt also has a small area on his left nostril and a rash to his left inner arm.

## 2021-01-01 NOTE — Discharge Instructions (Addendum)
Take the Augmentin twice daily with food for 10 days.  Apply Aquaphor to your lips to help with the dryness.  Return for re-evaluation for new or worsening symptoms.

## 2022-05-13 IMAGING — CR DG CHEST 2V
1 series · 3 of 3 positions shown · non-contrast
Comparison: 09/12/2011

CLINICAL DATA: VT164-BP diagnosed 6 days ago, short of breath

EXAM:
CHEST - 2 VIEW

[Series 1: dg chest 2 view · 0.14mm/px · 3 of 3 slices shown]
[im 1/3]
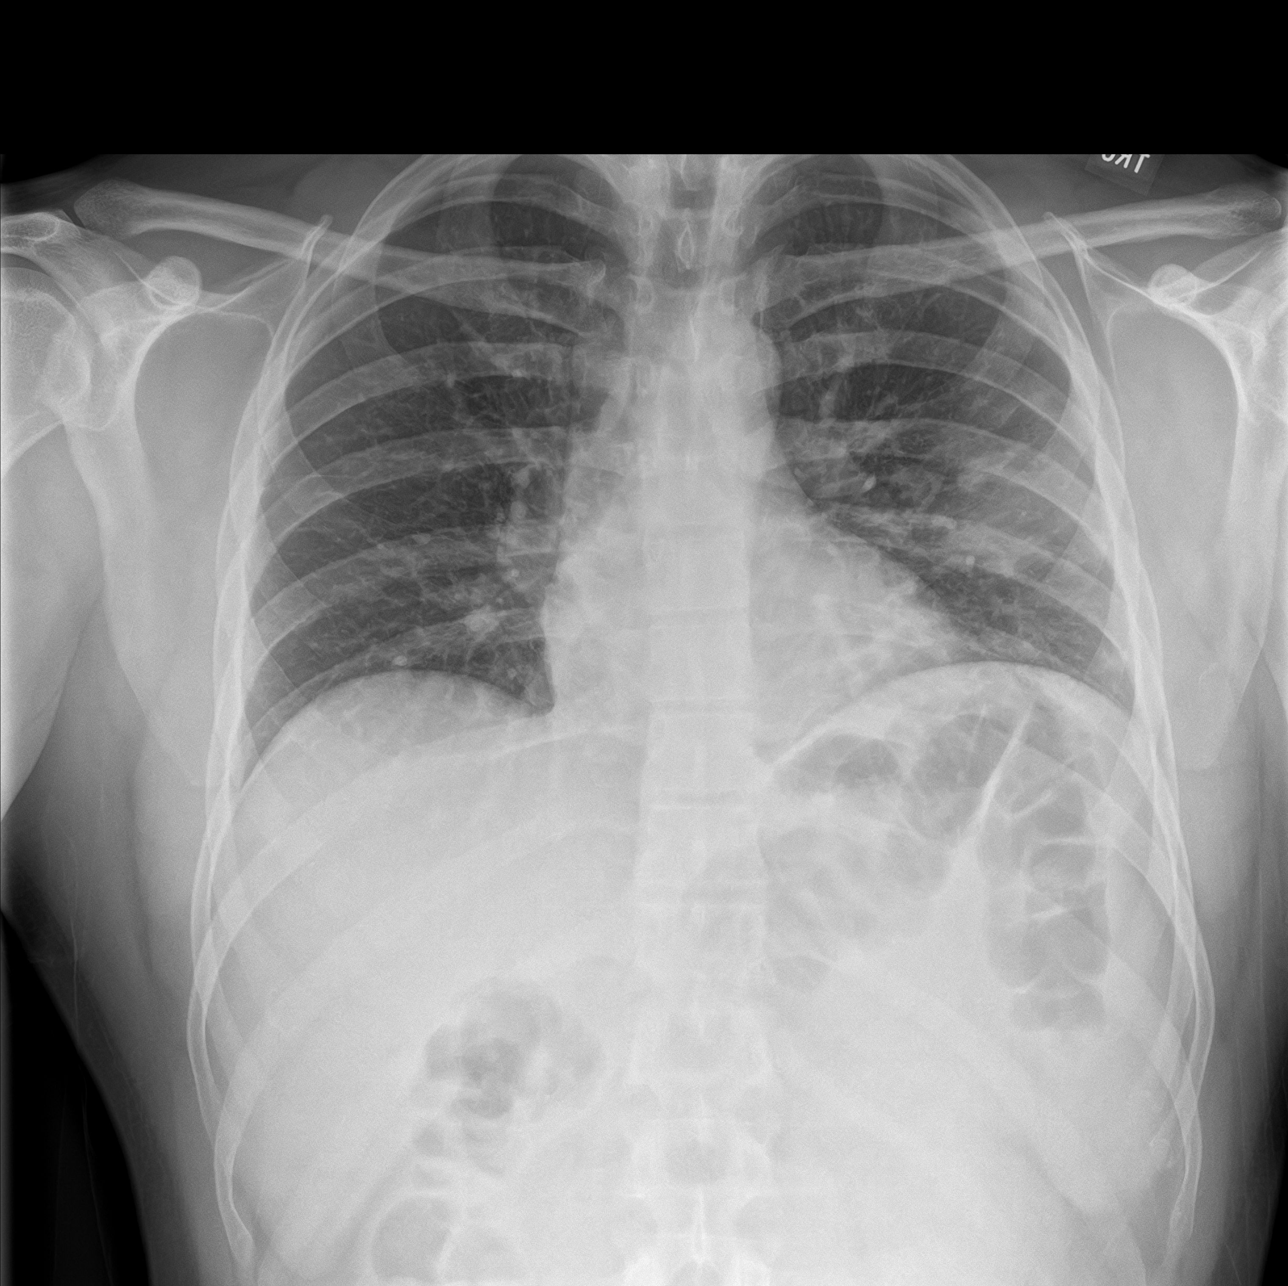
[im 2/3]
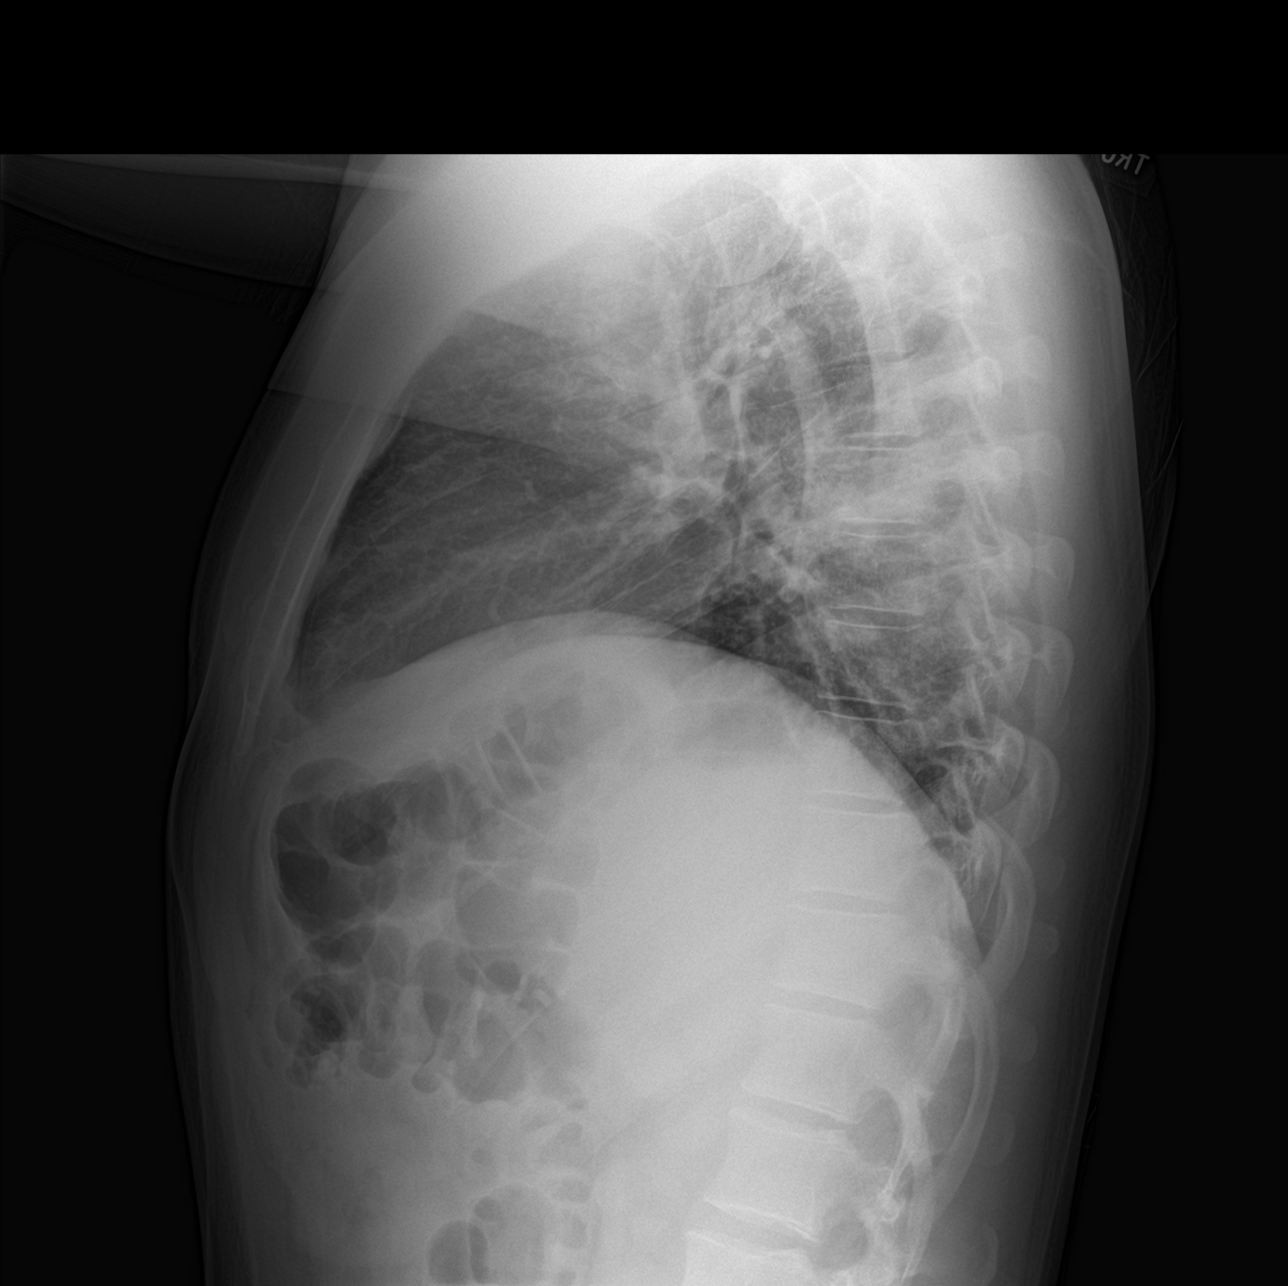
[im 3/3]
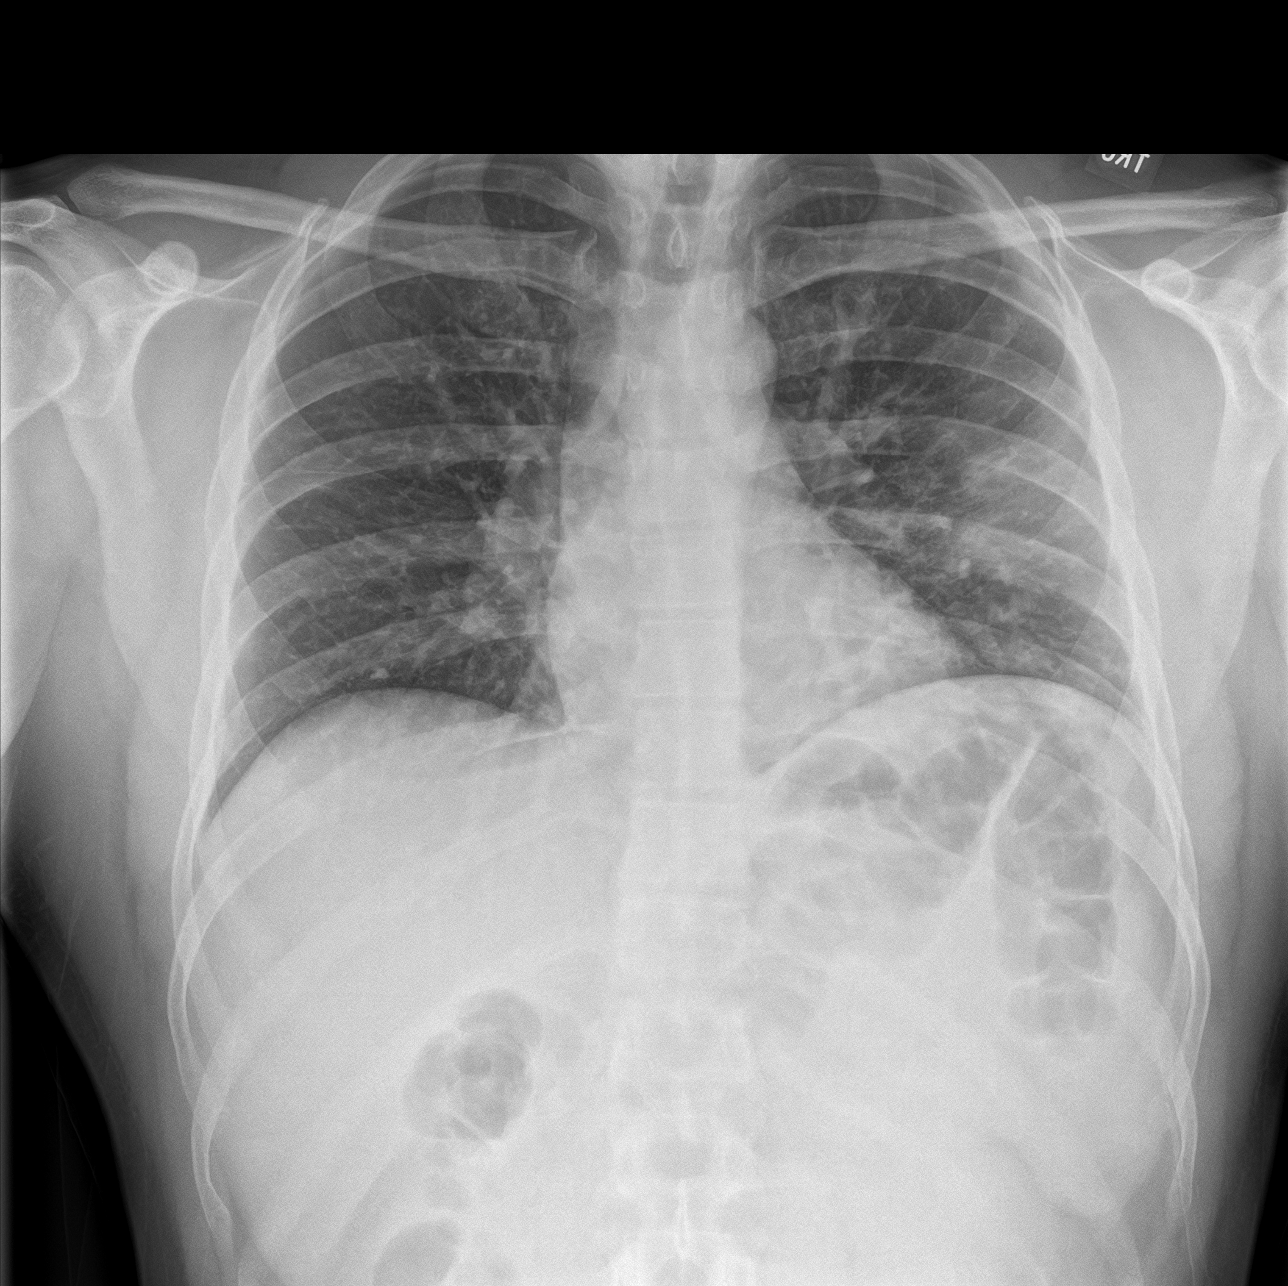

[3 of 3 positions shown; findings below may reference images not displayed]

FINDINGS: Frontal and lateral views of the chest demonstrate an unremarkable
cardiac silhouette. Multifocal interstitial prominence, with patchy
areas of consolidation greatest in the left lower lobe. Pattern is
most consistent with multifocal VT164-BP pneumonia given clinical
presentation. No effusion or pneumothorax. No acute bony
abnormalities.
IMPRESSION: 1. Findings consistent with multifocal pneumonia greatest in the
left lower lobe, consistent with VT164-BP.

## 2022-05-21 ENCOUNTER — Ambulatory Visit
Admission: EM | Admit: 2022-05-21 | Discharge: 2022-05-21 | Disposition: A | Discharge status: Home / Self Care | Payer: Commercial Managed Care - HMO | Attending: Family Medicine | Admitting: Family Medicine

## 2022-05-21 ENCOUNTER — Encounter: Payer: Self-pay | Admitting: Emergency Medicine

## 2022-05-21 DIAGNOSIS — H6012 Cellulitis of left external ear: Secondary | ICD-10-CM

## 2022-05-21 MED ORDER — TRIAMCINOLONE ACETONIDE 0.1 % EX OINT: 1.0000 | TOPICAL_OINTMENT | Freq: Two times a day (BID) | CUTANEOUS | 0 refills | Status: AC

## 2022-05-21 MED ORDER — DOXYCYCLINE HYCLATE 100 MG PO CAPS: 100.0000 mg | ORAL_CAPSULE | Freq: Two times a day (BID) | ORAL | 0 refills | Status: AC

## 2022-05-21 NOTE — Discharge Instructions (Addendum)
Stop by the pharmacy to pick up your steroids and antibiotics.  If this does not take your ear nodules away follow-up with an ear nose and throat doctor. You may have epidermoid cysts.

## 2022-05-21 NOTE — ED Provider Notes (Signed)
[facility]    CSN: [id number] Arrival date & time: 05/21/22  1024      History   Chief Complaint Chief Complaint  Patient presents with   Facial Swelling    HPI Shane Kramer is a 36 y.o. male.   HPI  Shane Kramer presents for left ear rash that started about 6 months ago.  States that every 2 weeks he has a rash that shows up on his left ear and then last about 2 weeks and then goes away and returns about 2 weeks later.  He has not had any fever, ear pain, vomiting, neck pain or jaw pain.  States he feels some nodules inside the earlobe. There has been redness and warmth. No discharge or drainage.  Has had a headache this week that has resolved with OTC medications.     History reviewed. No pertinent past medical history.  There are no problems to display for this patient.   History reviewed. No pertinent surgical history.     Home Medications    Prior to Admission medications   Medication Sig Start Date End Date Taking? Authorizing Provider  doxycycline (VIBRAMYCIN) 100 MG capsule Take 1 capsule (100 mg total) by mouth 2 (two) times daily for 7 days. 05/21/22 05/28/22 Yes Keyen Marban, DO  triamcinolone ointment (KENALOG) 0.1 % Apply 1 Application topically 2 (two) times daily. 05/21/22  Yes Jiyaan Steinhauser, DO  albuterol (VENTOLIN HFA) 108 (90 Base) MCG/ACT inhaler Inhale 2 puffs into the lungs every 6 (six) hours as needed for wheezing or shortness of breath. 06/08/20   Lorre Munroe, NP    Family History No family history on file.  Social History Social History   Tobacco Use   Smoking status: Never   Smokeless tobacco: Never  Substance Use Topics   Alcohol use: Yes   Drug use: Never     Allergies   Patient has no known allergies.   Review of Systems Review of Systems :negative unless otherwise stated in HPI.      Physical Exam Triage Vital Signs ED Triage Vitals  Enc Vitals Group     BP 05/21/22 1036 123/84     Pulse Rate  05/21/22 1036 72     Resp 05/21/22 1036 15     Temp 05/21/22 1036 98.4 F (36.9 C)     Temp Source 05/21/22 1036 Oral     SpO2 05/21/22 1036 98 %     Weight --      Height --      Head Circumference --      Peak Flow --      Pain Score 05/21/22 1035 0     Pain Loc --      Pain Edu? --      Excl. in GC? --    No data found.  Updated Vital Signs BP 123/84 (BP Location: Right Arm)   Pulse 72   Temp 98.4 F (36.9 C) (Oral)   Resp 15   SpO2 98%   Visual Acuity Right Eye Distance:   Left Eye Distance:   Bilateral Distance:    Right Eye Near:   Left Eye Near:    Bilateral Near:     Physical Exam  GEN: alert, well appearing male, in no acute distress  EYES: extra occular movements intact, no scleral injection or drainage HENT: right and left TM normal, normal auditory canal, left ear: lobule with warmth and redness,  CV: regular rate  RESP: no increased  work of breathing SKIN: see HENT above     UC Treatments / Results  Labs (all labs ordered are listed, but only abnormal results are displayed) Labs Reviewed - No data to display  EKG   Radiology No results found.  Procedures Procedures (including critical care time)  Medications Ordered in UC Medications - No data to display  Initial Impression / Assessment and Plan / UC Course  I have reviewed the triage vital signs and the nursing notes.  Pertinent labs & imaging results that were available during my care of the patient were reviewed by me and considered in my medical decision making (see chart for details).     Patient is a 36 y.o. malewho presents for intermittent nodules in his left earlobe for the past 6 months.  This week he started having warmth and redness.  Overall, patient is well-appearing and well-hydrated.  Vital signs stable.  Edu is afebrile.  Exam concerning for earlobe cellulitis on the left.  This may be infectious versus inflammatory.  Treat with steroid ointment and antibiotics as  below.  Considered epidermoid cyst which may get bigger and smaller.  Advised if this does not take his symptoms away that he should follow-up with an ear, nose and throat doctor/ENT.  Understanding voiced   Reviewed expectations regarding course of current medical issues.  All questions asked were answered.  Outlined signs and symptoms indicating need for more acute intervention. Patient verbalized understanding. After Visit Summary given.   Final Clinical Impressions(s) / UC Diagnoses   Final diagnoses:  Cellulitis of left ear     Discharge Instructions      Stop by the pharmacy to pick up your steroids and antibiotics.  If this does not take your ear nodules away follow-up with an ear nose and throat doctor. You may have epidermoid cysts.       ED Prescriptions     Medication Sig Dispense Auth. Provider   triamcinolone ointment (KENALOG) 0.1 % Apply 1 Application topically 2 (two) times daily. 30 g Amerie Beaumont, DO   doxycycline (VIBRAMYCIN) 100 MG capsule Take 1 capsule (100 mg total) by mouth 2 (two) times daily for 7 days. 14 capsule Duan Scharnhorst, DO      PDMP not reviewed this encounter.              Katha Cabal, DO 05/21/22 1142

## 2022-05-21 NOTE — ED Triage Notes (Signed)
Pt reports intermittent left ear lobe with get swollen and red for about 2 weeks then will go away for several months. This been ongoing for about 6 months. Denies pain or drainage.
